# Patient Record
Sex: Female | Born: 1995 | Race: White | Hispanic: No | Marital: Single | Smoking: Smoker, current status unknown
Health system: Southern US, Community
[De-identification: ages and names within clinical notes are randomized; demographics above are authoritative.]

---

## 2019-01-28 ENCOUNTER — Encounter: Payer: Self-pay | Admitting: Primary Care

## 2019-01-28 ENCOUNTER — Ambulatory Visit (INDEPENDENT_AMBULATORY_CARE_PROVIDER_SITE_OTHER): Payer: 59 | Admitting: Primary Care

## 2019-01-28 VITALS — BP 120/76 | HR 70 | Temp 98.4°F | Ht 64.25 in | Wt 238.5 lb

## 2019-01-28 DIAGNOSIS — F4323 Adjustment disorder with mixed anxiety and depressed mood: Secondary | ICD-10-CM | POA: Diagnosis not present

## 2019-01-28 DIAGNOSIS — Z0000 Encounter for general adult medical examination without abnormal findings: Secondary | ICD-10-CM | POA: Diagnosis not present

## 2019-01-28 DIAGNOSIS — Z23 Encounter for immunization: Secondary | ICD-10-CM

## 2019-01-28 LAB — COMPREHENSIVE METABOLIC PANEL
ALT: 14 U/L (ref 0–35)
AST: 14 U/L (ref 0–37)
Albumin: 4.4 g/dL (ref 3.5–5.2)
Alkaline Phosphatase: 66 U/L (ref 39–117)
BUN: 10 mg/dL (ref 6–23)
CO2: 26 mEq/L (ref 19–32)
Calcium: 9.5 mg/dL (ref 8.4–10.5)
Chloride: 104 mEq/L (ref 96–112)
Creatinine, Ser: 0.77 mg/dL (ref 0.40–1.20)
GFR: 93.53 mL/min (ref >60.00)
Glucose, Bld: 91 mg/dL (ref 70–99)
Potassium: 4.4 mEq/L (ref 3.5–5.1)
Sodium: 137 mEq/L (ref 135–145)
Total Bilirubin: 0.3 mg/dL (ref 0.2–1.2)
Total Protein: 7.3 g/dL (ref 6.0–8.3)

## 2019-01-28 LAB — CBC
HCT: 39.1 % (ref 36.0–46.0)
Hemoglobin: 12.9 g/dL (ref 12.0–15.0)
MCHC: 33 g/dL (ref 30.0–36.0)
MCV: 85.6 fl (ref 78.0–100.0)
Platelets: 326 10*3/uL (ref 150.0–400.0)
RBC: 4.56 Mil/uL (ref 3.87–5.11)
RDW: 14.1 % (ref 11.5–15.5)
WBC: 9.9 10*3/uL (ref 4.0–10.5)

## 2019-01-28 LAB — LIPID PANEL
Cholesterol: 146 mg/dL (ref 0–200)
HDL: 37.7 mg/dL — ABNORMAL LOW (ref >39.00)
LDL Cholesterol: 87 mg/dL (ref 0–99)
NonHDL: 108.7
Total CHOL/HDL Ratio: 4
Triglycerides: 111 mg/dL (ref 0.0–149.0)
VLDL: 22.2 mg/dL (ref 0.0–40.0)

## 2019-01-28 LAB — VITAMIN B12: Vitamin B-12: 140 pg/mL — ABNORMAL LOW (ref 211–911)

## 2019-01-28 LAB — TSH: TSH: 1.14 u[IU]/mL (ref 0.35–4.50)

## 2019-01-28 LAB — HEMOGLOBIN A1C: Hgb A1c MFr Bld: 5.6 % (ref 4.6–6.5)

## 2019-01-28 LAB — VITAMIN D 25 HYDROXY (VIT D DEFICIENCY, FRACTURES): VITD: 21.41 ng/mL — AB (ref 30.00–100.00)

## 2019-01-28 MED ORDER — SERTRALINE HCL 25 MG PO TABS: 25.0000 mg | ORAL_TABLET | Freq: Every day | ORAL | 1 refills | Status: DC

## 2019-01-28 NOTE — Patient Instructions (Addendum)
Stop by the lab prior to leaving today. I will notify you of your results once received.   Continue exercising. You should be getting 150 minutes of moderate intensity exercise weekly.  It's important to improve your diet by reducing consumption of fast food, fried food, processed snack foods, sugary drinks. Increase consumption of fresh vegetables and fruits, whole grains, water.  Ensure you are drinking 64 ounces of water daily.  Start sertraline (Zolof) 25 mg for anxiety and depression. Start by taking 1/2 tablet daily for 6 days, then increase to 1 full tablet thereafter.  Please get logged onto my chart so we can communicate and so that you can view your labs.  Schedule a follow up visit for 6 weeks for re-evaluation. Please message/call me sooner if you have any problems.  It was a pleasure to see you today! Please don't hesitate to call or message me with any questions. Welcome to [facility]!   Preventive Care 18-39 Years, Female Preventive care refers to lifestyle choices and visits with your health care provider that can promote health and wellness. What does preventive care include?   A yearly physical exam. This is also called an annual well check.  Dental exams once or twice a year.  Routine eye exams. Ask your health care provider how often you should have your eyes checked.  Personal lifestyle choices, including: ? Daily care of your teeth and gums. ? Regular physical activity. ? Eating a healthy diet. ? Avoiding tobacco and drug use. ? Limiting alcohol use. ? Practicing safe sex. ? Taking vitamin and mineral supplements as recommended by your health care provider. What happens during an annual well check? The services and screenings done by your health care provider during your annual well check will depend on your age, overall health, lifestyle risk factors, and family history of disease. Counseling Your health care provider may ask you questions about  your:  Alcohol use.  Tobacco use.  Drug use.  Emotional well-being.  Home and relationship well-being.  Sexual activity.  Eating habits.  Work and work Statistician.  Method of birth control.  Menstrual cycle.  Pregnancy history. Screening You may have the following tests or measurements:  Height, weight, and BMI.  Diabetes screening. This is done by checking your blood sugar (glucose) after you have not eaten for a while (fasting).  Blood pressure.  Lipid and cholesterol levels. These may be checked every 5 years starting at age 9.  Skin check.  Hepatitis C blood test.  Hepatitis B blood test.  Sexually transmitted disease (STD) testing.  BRCA-related cancer screening. This may be done if you have a family history of breast, ovarian, tubal, or peritoneal cancers.  Pelvic exam and Pap test. This may be done every 3 years starting at age 43. Starting at age 41, this may be done every 5 years if you have a Pap test in combination with an HPV test. Discuss your test results, treatment options, and if necessary, the need for more tests with your health care provider. Vaccines Your health care provider may recommend certain vaccines, such as:  Influenza vaccine. This is recommended every year.  Tetanus, diphtheria, and acellular pertussis (Tdap, Td) vaccine. You may need a Td booster every 10 years.  Varicella vaccine. You may need this if you have not been vaccinated.  HPV vaccine. If you are 76 or younger, you may need three doses over 6 months.  Measles, mumps, and rubella (MMR) vaccine. You may need at least one  dose of MMR. You may also need a second dose.  Pneumococcal 13-valent conjugate (PCV13) vaccine. You may need this if you have certain conditions and were not previously vaccinated.  Pneumococcal polysaccharide (PPSV23) vaccine. You may need one or two doses if you smoke cigarettes or if you have certain conditions.  Meningococcal vaccine. One  dose is recommended if you are age 47-21 years and a first-year college student living in a residence hall, or if you have one of several medical conditions. You may also need additional booster doses.  Hepatitis A vaccine. You may need this if you have certain conditions or if you travel or work in places where you may be exposed to hepatitis A.  Hepatitis B vaccine. You may need this if you have certain conditions or if you travel or work in places where you may be exposed to hepatitis B.  Haemophilus influenzae type b (Hib) vaccine. You may need this if you have certain risk factors. Talk to your health care provider about which screenings and vaccines you need and how often you need them. This information is not intended to replace advice given to you by your health care provider. Make sure you discuss any questions you have with your health care provider. Document Released: 01/31/2002 Document Revised: 07/18/2017 Document Reviewed: 10/06/2015 Elsevier Interactive Patient Education  2019 [employer].

## 2019-01-28 NOTE — Assessment & Plan Note (Signed)
Tetanus due today, provided. Declines influenza vaccination. HPV completed. Pap smear UTD per patient, follows with GYN. Discussed the importance of a healthy diet and regular exercise in order for weight loss, and to reduce the risk of any potential medical problems. Discussed to stop fast food, sweet tea. Increase home cooked meals. Exam unremarkable. Labs pending. Follow up in 1 year for CPE.

## 2019-01-28 NOTE — Addendum Note (Signed)
Addended by: Tawnya Crook on: 01/28/2019 12:31 PM   Modules accepted: Orders

## 2019-01-28 NOTE — Assessment & Plan Note (Signed)
Chronic for years, GAD 7 score of 17 and PHQ 9 score of 14 today.   Given symptoms and duration of symptoms will likely need treatment. We discussed several options for treatment including therapy vs medication. She kindly declines therapy. Rx for Zoloft 25 mg sent to pharmacy.   Patient is to take 1/2 tablet daily for 6 days, then advance to 1 full tablet thereafter. We discussed possible side effects of headache, GI upset, drowsiness, and SI/HI. If thoughts of SI/HI develop, we discussed to present to the emergency immediately. Patient verbalized understanding.   Follow up in 6 weeks for re-evaluation.

## 2019-01-28 NOTE — Progress Notes (Signed)
Subjective:    Patient ID: Emily Yates, female    DOB: Jan 14, 1996, 23 y.o.   MRN: [id number]  HPI  Emily Yates is a 23 year old female who presents today to establish care, complete physical, and discuss the problems mentioned below. Will obtain old records.  Symptoms of chest tightness, worry, irritability, feeling sad/down, began 2-3 years ago. Family history of depression in mother. She's never undergone treatment in the past. She has a fear of death when driving, avoids populated areas with traffic. GAD 7 score of 17 and PHQ 9 of 14 today.  Immunizations: -Tetanus: Completed in 2009 -Influenza: Declines  -HPV: Completed in 2010-2011  Diet: She endorses a poor diet Breakfast: Skips Lunch: Fast food Dinner: Fast food  Snacks: None Desserts: Twice weekly  Beverages: Water, sweet tea, occasional soda  Exercise: Plays softball for [employer], exercises 6 days weekly Eye exam: Completed in 2019 Dental exam: No recent exam Pap Smear: Completed at 21, follows with GYN.    Review of Systems  Constitutional: Negative for unexpected weight change.  HENT: Negative for rhinorrhea.   Respiratory: Negative for cough and shortness of breath.   Cardiovascular: Negative for chest pain.  Gastrointestinal: Negative for constipation and diarrhea.  Endocrine: Positive for polyuria. Negative for polydipsia and polyphagia.  Genitourinary: Negative for difficulty urinating and menstrual problem.  Musculoskeletal: Negative for arthralgias and myalgias.  Skin: Negative for rash.  Allergic/Immunologic: Positive for environmental allergies.  Neurological: Negative for dizziness, numbness and headaches.       Headaches occurring 1-2 times weekly, located to bilateral front lobe. Takes Ibuprofen and sleeps with resolve. Does have photophobia.   Psychiatric/Behavioral:       Concerns for anxiety and depression. See HPI     History reviewed. No pertinent past medical history.     Social History   Socioeconomic History  . Marital status: Single    Spouse name: Not on file  . Number of children: Not on file  . Years of education: Not on file  . Highest education level: Not on file  Occupational History  . Not on file  Social Needs  . Financial resource strain: Not on file  . Food insecurity:    Worry: Not on file    Inability: Not on file  . Transportation needs:    Medical: Not on file    Non-medical: Not on file  Tobacco Use  . Smoking status: Smoker, Current Status Unknown  . Smokeless tobacco: Never Used  . Tobacco comment: Vaping  Substance and Sexual Activity  . Alcohol use: Yes  . Drug use: Not on file  . Sexual activity: Not on file  Lifestyle  . Physical activity:    Days per week: Not on file    Minutes per session: Not on file  . Stress: Not on file  Relationships  . Social connections:    Talks on phone: Not on file    Gets together: Not on file    Attends religious service: Not on file    Active member of club or organization: Not on file    Attends meetings of clubs or organizations: Not on file    Relationship status: Not on file  . Intimate partner violence:    Fear of current or ex partner: Not on file    Emotionally abused: Not on file    Physically abused: Not on file    Forced sexual activity: Not on file  Other Topics Concern  . Not  on file  Social History Scientist, research (medical)arrative   Student at [employer].   Plays softball.   Aspires to pursue MBA.    History reviewed. No pertinent surgical history.  Family History  Problem Relation Age of Onset  . Arthritis Mother   . Depression Mother   . Miscarriages / [place] Mother   . Depression Father   . Hypertension Father     No Known Allergies  No current outpatient medications on file prior to visit.   No current facility-administered medications on file prior to visit.     BP 120/76   Pulse 70   Temp 98.4 F (36.9 C) (Oral)   Ht 5' 4.25" (1.632 m)   Wt 238  lb 8 oz (108.2 kg)   SpO2 98%   BMI 40.62 kg/m    Objective:   Physical Exam  Constitutional: She is oriented to person, place, and time. She appears well-nourished.  HENT:  Mouth/Throat: No oropharyngeal exudate.  Eyes: Pupils are equal, round, and reactive to light. EOM are normal.  Neck: Neck supple. No thyromegaly present.  Cardiovascular: Normal rate and regular rhythm.  Respiratory: Effort normal and breath sounds normal.  GI: Soft. Bowel sounds are normal. There is no abdominal tenderness.  Musculoskeletal: Normal range of motion.  Neurological: She is alert and oriented to person, place, and time.  Skin: Skin is warm and dry.  Psychiatric: She has a normal mood and affect.           Assessment & Plan:

## 2019-02-01 ENCOUNTER — Other Ambulatory Visit: Payer: Self-pay | Admitting: Primary Care

## 2019-02-01 DIAGNOSIS — E538 Deficiency of other specified B group vitamins: Secondary | ICD-10-CM

## 2019-02-01 NOTE — Telephone Encounter (Signed)
Emily Yates, we need monthly B12 nurse visits x 6 months, repeat lab only appointment in 3 months for B12 check. Can you help? Thanks!

## 2019-02-04 ENCOUNTER — Telehealth: Payer: Self-pay | Admitting: Primary Care

## 2019-02-04 NOTE — Telephone Encounter (Signed)
Lvm asking pt to call office to schedule appointments for b12 per [facility]. Pt needs to be scheduled for b12 injections monthly for the next 6 months. Pt also needs a lab only appointment to check b12 levels at 3 months.

## 2019-02-06 DIAGNOSIS — R55 Syncope and collapse: Secondary | ICD-10-CM

## 2019-02-08 ENCOUNTER — Encounter: Payer: Self-pay | Admitting: Primary Care

## 2019-02-20 ENCOUNTER — Ambulatory Visit (INDEPENDENT_AMBULATORY_CARE_PROVIDER_SITE_OTHER): Payer: 59

## 2019-02-20 DIAGNOSIS — E538 Deficiency of other specified B group vitamins: Secondary | ICD-10-CM

## 2019-02-20 MED ORDER — CYANOCOBALAMIN 1000 MCG/ML IJ SOLN
1000.0000 ug | INTRAMUSCULAR | Status: DC
  Administered 2019-02-20: 1000 ug via INTRAMUSCULAR

## 2019-02-20 NOTE — Progress Notes (Signed)
Per orders of Katherine Clark, NP, injection of B12 given by Cortnee Steinmiller Y.  Patient tolerated injection well.  

## 2019-03-04 ENCOUNTER — Other Ambulatory Visit (HOSPITAL_COMMUNITY): Payer: 59

## 2019-03-08 ENCOUNTER — Telehealth: Payer: Self-pay | Admitting: Primary Care

## 2019-03-08 NOTE — Telephone Encounter (Signed)
Lvm asking pt to call office. Wanted to discuss making 3/23 telephone call to prevent patient from having to come into office.

## 2019-03-11 ENCOUNTER — Ambulatory Visit: Payer: 59 | Admitting: Primary Care

## 2019-03-13 ENCOUNTER — Telehealth (INDEPENDENT_AMBULATORY_CARE_PROVIDER_SITE_OTHER): Payer: 59 | Admitting: Primary Care

## 2019-03-13 ENCOUNTER — Other Ambulatory Visit: Payer: Self-pay

## 2019-03-13 DIAGNOSIS — E538 Deficiency of other specified B group vitamins: Secondary | ICD-10-CM

## 2019-03-13 DIAGNOSIS — F4323 Adjustment disorder with mixed anxiety and depressed mood: Secondary | ICD-10-CM | POA: Diagnosis not present

## 2019-03-13 MED ORDER — SERTRALINE HCL 25 MG PO TABS: 25.0000 mg | ORAL_TABLET | Freq: Every day | ORAL | 1 refills | Status: DC

## 2019-03-13 NOTE — Telephone Encounter (Signed)
Irving Burton, Will you please put her on the schedule for anytime today for phone or webex visit? No need to call her. I'll just call her when I get a chance. She is aware.

## 2019-03-13 NOTE — Patient Instructions (Signed)
Continue taking sertraline (Zoloft) 25 mg daily for anxiety and depression. I just sent plenty of refills to your pharmacy in [place].  Continue taking B12 1000 mcg tablets daily. We will contact you when you are available to set up your B12 injection. Please do not hesitate to message me with any questions/concerns.  It was nice to talk to you! Mayra Reel, NP-C

## 2019-03-13 NOTE — Progress Notes (Signed)
   Emily Yates - 23 y.o. female  MRN [id number]  Date of Birth: [birth year]  PCP: Doreene Nest, NP  This service was provided via telemedicine. Phone Visit performed on 03/13/2019   Patient initially contacted at 9:10 am, no answer, voice mail left for call back. Patient contacted again at 1 pm and answered.   Rationale for phone visit reviewed. Patient consented to telephone encounter.    [place] Location of provider: Office at [facility] Name of referring provider: N/A   Names of persons and role in encounter: Provider: Doreene Nest, NP  Patient: Emily Yates  Other: N/A   Time on call: 11 minutes   Subjective: CC: Follow up of Anxiety  HPI:  Emily Yates is a 23 year old female who presents today via phone for follow up of anxiety and depression.  She was last evaluated on January 28, 2019 with a 2 to 3-year history of chest tightness, daily worry, irritability, feeling sad/down.  GAD 7 score of 17 and PHQ 9 score of 14 that day so she was initiated on Zoloft 25 mg.  Of note labs completed that day showed vitamin B12 deficiency so she was initiated on monthly injections.  Other lab testing including TSH, CBC were unremarkable.  Since her last evaluation she's doing better. Positive effects from Zoloft include feeling less stressed, is able to control her emotions better, feeling less sad/down, less worry. GAD 7 score of 5 today, PHQ 9 score of 5 today. Denies SI/HI, GI upset, nausea, headaches.    Objective/Observations:   No physical exam or vital signs collected unless specifically identified.  Respiratory status: speaks in complete sentences without evident shortness of breath.   Assessment/Plan:  Appears to have significantly improved with addition of Zoloft 25 mg.  She is having no negative side effects. We will continue Zoloft 25 mg daily as prescribed. She will update with any changes.   No  problem-specific Assessment & Plan notes found for this encounter.   I discussed the assessment and treatment plan with the patient. The patient was provided an opportunity to ask questions and all were answered. The patient agreed with the plan and demonstrated an understanding of the instructions.  Lab Orders  No laboratory test(s) ordered today    No orders of the defined types were placed in this encounter.   The patient was advised to call back or seek an in-person evaluation if the symptoms worsen or if the condition fails to improve as anticipated.  Doreene Nest, NP

## 2019-03-13 NOTE — Assessment & Plan Note (Signed)
Appears to have significantly improved with addition of Zoloft 25 mg.  She is having no negative side effects. We will continue Zoloft 25 mg daily as prescribed. She will update with any changes.

## 2019-03-13 NOTE — Assessment & Plan Note (Signed)
Recent level of 140. Has completed 1 B12 injection, has not been able to complete subsequent injections due to pandemic of Covid-19. There is a shelter in place order that was initiated today. She is compliant to 1000 mcg once daily for which we will continue for now. We will contact her when able to complete subsequent injections and lab repeat.

## 2019-03-27 ENCOUNTER — Ambulatory Visit: Payer: 59

## 2019-03-27 ENCOUNTER — Ambulatory Visit (INDEPENDENT_AMBULATORY_CARE_PROVIDER_SITE_OTHER): Payer: 59

## 2019-03-27 ENCOUNTER — Other Ambulatory Visit: Payer: Self-pay

## 2019-03-27 DIAGNOSIS — E538 Deficiency of other specified B group vitamins: Secondary | ICD-10-CM | POA: Diagnosis not present

## 2019-03-27 MED ORDER — CYANOCOBALAMIN 1000 MCG/ML IJ SOLN
1000.0000 ug | Freq: Once | INTRAMUSCULAR | Status: AC
  Administered 2019-03-27: 1000 ug via INTRAMUSCULAR

## 2019-03-27 NOTE — Progress Notes (Signed)
Pt given B12 injection in Right Deltoid. Pt tolerated well.  

## 2019-04-12 NOTE — Progress Notes (Signed)
Approved.  

## 2019-04-30 ENCOUNTER — Telehealth: Payer: Self-pay

## 2019-04-30 ENCOUNTER — Other Ambulatory Visit: Payer: 59

## 2019-04-30 ENCOUNTER — Ambulatory Visit: Payer: 59

## 2019-04-30 NOTE — Telephone Encounter (Signed)
[facility] Night - Client Nonclinical Telephone Record [facility] Client [facility] Night - Client Client Site [facility] - Night Physician [facility] - PHYSICIAN, Crissie Figures- MD Contact Type Call Who Is Calling Patient / Member / Family / Caregiver Caller Name Adaleena Maring Caller Phone Number [phone] Patient Name Emily Yates Patient DOB [birth year] Call Type Message Only Information Provided Reason for Call Request to [facility] Appointment Initial Comment Caller states she needs to cancel her 9:00am appt for labs and nurse visit at 9:15.

## 2019-04-30 NOTE — Telephone Encounter (Signed)
Called to let patient know appointments were cancelled and to see if she wanted to reschedule. Lvm explaining this.

## 2019-06-04 ENCOUNTER — Telehealth: Payer: Self-pay

## 2019-06-04 ENCOUNTER — Other Ambulatory Visit: Payer: 59

## 2019-06-04 DIAGNOSIS — Z20822 Contact with and (suspected) exposure to covid-19: Secondary | ICD-10-CM

## 2019-06-04 NOTE — Telephone Encounter (Signed)
Tera Helper request COVID 19 test due to exposure.Pt. scheduled for today.

## 2019-06-06 LAB — NOVEL CORONAVIRUS, NAA: SARS-CoV-2, NAA: NOT DETECTED

## 2019-06-07 ENCOUNTER — Telehealth: Payer: Self-pay | Admitting: Primary Care

## 2019-06-07 NOTE — Telephone Encounter (Signed)
° °  Pt was given COVID results °

## 2019-09-05 ENCOUNTER — Other Ambulatory Visit: Payer: Self-pay

## 2019-09-05 DIAGNOSIS — Z20822 Contact with and (suspected) exposure to covid-19: Secondary | ICD-10-CM

## 2019-09-07 LAB — NOVEL CORONAVIRUS, NAA: SARS-CoV-2, NAA: NOT DETECTED

## 2019-10-16 ENCOUNTER — Other Ambulatory Visit: Payer: Self-pay

## 2019-10-16 DIAGNOSIS — Z20822 Contact with and (suspected) exposure to covid-19: Secondary | ICD-10-CM

## 2019-10-17 ENCOUNTER — Encounter: Payer: Self-pay | Admitting: Family Medicine

## 2019-10-17 ENCOUNTER — Telehealth: Payer: Self-pay | Admitting: Primary Care

## 2019-10-17 LAB — NOVEL CORONAVIRUS, NAA: SARS-CoV-2, NAA: NOT DETECTED

## 2019-10-17 NOTE — Telephone Encounter (Signed)
Patient called.  Patient needs documentation to show she tested negative for Covid, so she can return to work.  Patient would like to return to work tomorrow. Please call patient when note is ready. Since Anda Kraft and Vallarie Mare are out of the office, can Dr.Bedsole do a note?  Patient needs the note today.

## 2019-10-17 NOTE — Telephone Encounter (Signed)
Can you please review chart and let me know if I can write work note clearing patient to go back to work tomorrow.  Anda Kraft and Vallarie Mare are out of the office this afternoon.

## 2019-10-17 NOTE — Telephone Encounter (Signed)
Appears okay to return to work per [employer] from PCP. Looks like she planned to send note via [place]. I am not sure what happened. I will write note and send if able in MyChart.

## 2019-10-17 NOTE — Telephone Encounter (Signed)
This was attached earlier today.

## 2019-10-17 NOTE — Telephone Encounter (Signed)
Left message for Emily Yates that Dr. Diona Browner has sent her a work note to her 37.

## 2019-10-21 ENCOUNTER — Telehealth: Payer: Self-pay | Admitting: Primary Care

## 2019-10-21 NOTE — Telephone Encounter (Signed)
Alihsa @[employer]  called stating pt was tested for covid and received 2 work notes.  They needed one  note to say please excuse pt for 10/28 and 10/29 due covid testing  Please let pt know when note is ready or put on my chart for pt

## 2019-10-22 NOTE — Telephone Encounter (Signed)
This already been addressed through [facility]

## 2020-02-21 ENCOUNTER — Encounter: Payer: Self-pay | Admitting: Primary Care

## 2020-02-21 ENCOUNTER — Other Ambulatory Visit: Payer: Self-pay

## 2020-02-21 ENCOUNTER — Ambulatory Visit (INDEPENDENT_AMBULATORY_CARE_PROVIDER_SITE_OTHER)
Admission: RE | Admit: 2020-02-21 | Discharge: 2020-02-21 | Disposition: A | Discharge status: Home / Self Care | Payer: 59 | Source: Ambulatory Visit | Attending: Primary Care | Admitting: Primary Care

## 2020-02-21 ENCOUNTER — Ambulatory Visit (INDEPENDENT_AMBULATORY_CARE_PROVIDER_SITE_OTHER): Payer: 59 | Admitting: Primary Care

## 2020-02-21 VITALS — BP 126/76 | HR 78 | Temp 96.9°F | Ht 64.25 in | Wt 252.8 lb

## 2020-02-21 DIAGNOSIS — M25551 Pain in right hip: Secondary | ICD-10-CM

## 2020-02-21 DIAGNOSIS — E559 Vitamin D deficiency, unspecified: Secondary | ICD-10-CM | POA: Insufficient documentation

## 2020-02-21 DIAGNOSIS — E538 Deficiency of other specified B group vitamins: Secondary | ICD-10-CM | POA: Diagnosis not present

## 2020-02-21 DIAGNOSIS — F4323 Adjustment disorder with mixed anxiety and depressed mood: Secondary | ICD-10-CM

## 2020-02-21 DIAGNOSIS — R55 Syncope and collapse: Secondary | ICD-10-CM | POA: Diagnosis not present

## 2020-02-21 DIAGNOSIS — R42 Dizziness and giddiness: Secondary | ICD-10-CM | POA: Diagnosis not present

## 2020-02-21 HISTORY — DX: Pain in right hip: M25.551

## 2020-02-21 HISTORY — DX: Dizziness and giddiness: R42

## 2020-02-21 HISTORY — DX: Syncope and collapse: R55

## 2020-02-21 LAB — COMPREHENSIVE METABOLIC PANEL
ALT: 17 U/L (ref 0–35)
AST: 16 U/L (ref 0–37)
Albumin: 4.2 g/dL (ref 3.5–5.2)
Alkaline Phosphatase: 62 U/L (ref 39–117)
BUN: 13 mg/dL (ref 6–23)
CO2: 27 mEq/L (ref 19–32)
Calcium: 9.5 mg/dL (ref 8.4–10.5)
Chloride: 105 mEq/L (ref 96–112)
Creatinine, Ser: 0.78 mg/dL (ref 0.40–1.20)
GFR: 91.28 mL/min (ref >60.00)
Glucose, Bld: 88 mg/dL (ref 70–99)
Potassium: 3.9 mEq/L (ref 3.5–5.1)
Sodium: 138 mEq/L (ref 135–145)
Total Bilirubin: 0.4 mg/dL (ref 0.2–1.2)
Total Protein: 7.3 g/dL (ref 6.0–8.3)

## 2020-02-21 LAB — CBC
HCT: 37.8 % (ref 36.0–46.0)
Hemoglobin: 12.5 g/dL (ref 12.0–15.0)
MCHC: 33 g/dL (ref 30.0–36.0)
MCV: 84.5 fl (ref 78.0–100.0)
Platelets: 339 10*3/uL (ref 150.0–400.0)
RBC: 4.48 Mil/uL (ref 3.87–5.11)
RDW: 13.8 % (ref 11.5–15.5)
WBC: 9.8 10*3/uL (ref 4.0–10.5)

## 2020-02-21 LAB — TSH: TSH: 0.9 u[IU]/mL (ref 0.35–4.50)

## 2020-02-21 LAB — VITAMIN B12: Vitamin B-12: 150 pg/mL — ABNORMAL LOW (ref 211–911)

## 2020-02-21 LAB — VITAMIN D 25 HYDROXY (VIT D DEFICIENCY, FRACTURES): VITD: 19.12 ng/mL — ABNORMAL LOW (ref 30.00–100.00)

## 2020-02-21 MED ORDER — PREDNISONE 20 MG PO TABS: ORAL_TABLET | ORAL | 0 refills | Status: DC

## 2020-02-21 MED ORDER — SERTRALINE HCL 25 MG PO TABS: 25.0000 mg | ORAL_TABLET | Freq: Every day | ORAL | 3 refills | Status: DC

## 2020-02-21 NOTE — Assessment & Plan Note (Signed)
Acute for the last 10 days, also with near syncope.  Checking labs today including CBC, CMP, TSH, vitamin levels.  Ear and neuro exam unremarkable. Doesn't seem like vertigo.  ECG unremarkable. Checking echocardiogram to rule out cardiac cause.  Consider CT head given chronic headaches. Await results.

## 2020-02-21 NOTE — Progress Notes (Signed)
Subjective:    Patient ID: Emily Yates, female    DOB: 05/04/1996, 24 y.o.   MRN: [id number]  HPI  This visit occurred during the SARS-CoV-2 public health emergency.  Safety protocols were in place, including screening questions prior to the visit, additional usage of staff PPE, and extensive cleaning of exam room while observing appropriate contact time as indicated for disinfecting solutions.   Emily Yates is a 24 year old female who presents today with a chief complaint of lower extremity pain.  1) Hip Pain/Lower Extremity Numbness: Acute right lateral hip pain with numbness and tingling down the right lower extremity to the toes. This began 10 days ago. She will have to pick up her leg at times to move it. She has pain and decrease in ROM with flexion and external rotation.    She denies injury/trauma, back pain, left lower extremity numbness.   She's played softball since school age, has not played in one year. She would intermittently notice "popping" of her right hip during softball season, has noticed this more frequently now.  2) Dizziness/Near Syncope: Intermittent dizziness for the last week that occurs more often than not. She denies allergy symptoms, sensations of the room spinning around her, changes in diet, new supplements, visual changes, palpitations, ear fullness. She endorses plenty of water intake.   Feels like she's off balance. Has felt near syncopal episodes several times within the last week. She has a prior history of near syncope for which she mentioned in a My Chart message on 02/06/19. Per her message she endorses feeling like she's "blacking out" when running during softball practice with headaches occurring after. At that time we decided to pursue an echocardiogram given her symptoms while exerting herself, she never had this done. Her recent syncopal episodes feel similar to her prior episodes one year ago.  BP Readings from Last 3 Encounters:  02/21/20  126/76  01/28/19 120/76     Review of Systems  Constitutional: Negative for fatigue and fever.  Eyes: Negative for visual disturbance.  Respiratory: Negative for shortness of breath.   Cardiovascular: Negative for chest pain and palpitations.  Gastrointestinal: Negative for nausea.  Musculoskeletal: Negative for back pain.       Right hip pain  Skin: Negative for color change.  Allergic/Immunologic: Negative for environmental allergies.  Neurological: Positive for dizziness, weakness, numbness and headaches.       Near syncope        No past medical history on file.   Social History   Socioeconomic History  . Marital status: Single    Spouse name: Not on file  . Number of children: Not on file  . Years of education: Not on file  . Highest education level: Not on file  Occupational History  . Not on file  Tobacco Use  . Smoking status: Smoker, Current Status Unknown  . Smokeless tobacco: Never Used  . Tobacco comment: Vaping  Substance and Sexual Activity  . Alcohol use: Yes  . Drug use: Not on file  . Sexual activity: Not on file  Other Topics Concern  . Not on file  Social History Narrative   Ship broker at [employer].   Plays softball.   Aspires to pursue MBA.   Social Determinants of Health   Financial Resource Strain:   . Difficulty of Paying Living Expenses: Not on file  Food Insecurity:   . Worried About Charity fundraiser in the Last Year: Not on file  .  Ran Out of Food in the Last Year: Not on file  Transportation Needs:   . Lack of Transportation (Medical): Not on file  . Lack of Transportation (Non-Medical): Not on file  Physical Activity:   . Days of Exercise per Week: Not on file  . Minutes of Exercise per Session: Not on file  Stress:   . Feeling of Stress : Not on file  Social Connections:   . Frequency of Communication with Friends and Family: Not on file  . Frequency of Social Gatherings with Friends and Family: Not on file  .  Attends Religious Services: Not on file  . Active Member of Clubs or Organizations: Not on file  . Attends Banker Meetings: Not on file  . Marital Status: Not on file  Intimate Partner Violence:   . Fear of Current or Ex-Partner: Not on file  . Emotionally Abused: Not on file  . Physically Abused: Not on file  . Sexually Abused: Not on file    No past surgical history on file.  Family History  Problem Relation Age of Onset  . Arthritis Mother   . Depression Mother   . Miscarriages / [place] Mother   . Depression Father   . Hypertension Father     No Known Allergies  No current outpatient medications on file prior to visit.   Current Facility-Administered Medications on File Prior to Visit  Medication Dose Route Frequency Provider Last Rate Last Admin  . cyanocobalamin ((VITAMIN B-12)) injection 1,000 mcg  1,000 mcg Intramuscular Q30 days Doreene Nest, NP   1,000 mcg at 02/20/19 0936    BP 126/76   Pulse 78   Temp (!) 96.9 F (36.1 C) (Temporal)   Ht 5' 4.25" (1.632 m)   Wt 252 lb 12 oz (114.6 kg)   LMP 02/19/2020   SpO2 98%   BMI 43.05 kg/m    Objective:   Physical Exam  Constitutional: She appears well-nourished.  HENT:  Right Ear: Tympanic membrane and ear canal normal.  Left Ear: Tympanic membrane and ear canal normal.  Eyes: EOM are normal.  Cardiovascular: Normal rate and regular rhythm.  Respiratory: Effort normal and breath sounds normal.  Musculoskeletal:     Cervical back: Neck supple.     Right hip: No deformity, tenderness or bony tenderness. Decreased range of motion. Normal strength.       Legs:     Comments: Decrease in ROM with pain with flexion, internal and external rotation. 5/5 strength to bilateral lower extremities.   Neurological: No cranial nerve deficit. Coordination normal.  Skin: Skin is warm and dry.  Psychiatric: She has a normal mood and affect.           Assessment & Plan:

## 2020-02-21 NOTE — Assessment & Plan Note (Signed)
Non compliant to vitamin B12 injections or tablets. Repeat B12 pending.

## 2020-02-21 NOTE — Patient Instructions (Signed)
Stop by the lab and xray prior to leaving today. I will notify you of your results once received.   Stop by the front desk and speak with either Shirlee Limerick or Charmaine regarding your echocardiogram.  It was a pleasure to see you today!

## 2020-02-21 NOTE — Assessment & Plan Note (Signed)
Acute for the last 10 days, history of softball playing for years. Exam today with decrease in ROM as noted.  Seems to clearly have nerve involvement given the numbness sensation. Strength equal bilaterally.   Check plain films of the hip today. Physical therapy orders placed.   Consider MRI in the future if needed.

## 2020-02-21 NOTE — Assessment & Plan Note (Signed)
Doing well on sertraline 25 mg, refills provided.

## 2020-02-21 NOTE — Assessment & Plan Note (Signed)
Repeat D level pending. She is not taking supplements.

## 2020-02-21 NOTE — Assessment & Plan Note (Signed)
Intermittent and chronic for one year, recent recurring episodes over the last 10 days.   Check labs today including CBC, CMP, TSH.  Negative orthostatic vitals in office today. ECG today with NSR, rate of 62, no PAC/PVC, ST changes. No prior to compare.  Given recurrent episodes we need to rule out cardiac cause. Echocardiogram ordered and pending.

## 2020-02-22 ENCOUNTER — Other Ambulatory Visit: Payer: Self-pay | Admitting: Primary Care

## 2020-02-22 DIAGNOSIS — E559 Vitamin D deficiency, unspecified: Secondary | ICD-10-CM

## 2020-02-22 MED ORDER — VITAMIN D (ERGOCALCIFEROL) 1.25 MG (50000 UNIT) PO CAPS: ORAL_CAPSULE | ORAL | 0 refills | Status: DC

## 2020-02-25 ENCOUNTER — Ambulatory Visit (HOSPITAL_COMMUNITY): Payer: 59 | Attending: Cardiology

## 2020-02-25 ENCOUNTER — Other Ambulatory Visit: Payer: Self-pay

## 2020-02-25 DIAGNOSIS — R42 Dizziness and giddiness: Secondary | ICD-10-CM | POA: Insufficient documentation

## 2020-02-25 DIAGNOSIS — R55 Syncope and collapse: Secondary | ICD-10-CM | POA: Insufficient documentation

## 2020-02-26 ENCOUNTER — Encounter: Payer: Self-pay | Admitting: *Deleted

## 2020-02-27 ENCOUNTER — Ambulatory Visit (INDEPENDENT_AMBULATORY_CARE_PROVIDER_SITE_OTHER): Payer: 59 | Admitting: Primary Care

## 2020-02-27 ENCOUNTER — Other Ambulatory Visit: Payer: Self-pay

## 2020-02-27 ENCOUNTER — Other Ambulatory Visit: Payer: Self-pay | Admitting: Primary Care

## 2020-02-27 DIAGNOSIS — F4323 Adjustment disorder with mixed anxiety and depressed mood: Secondary | ICD-10-CM

## 2020-02-27 DIAGNOSIS — G43709 Chronic migraine without aura, not intractable, without status migrainosus: Secondary | ICD-10-CM | POA: Diagnosis not present

## 2020-02-27 DIAGNOSIS — R55 Syncope and collapse: Secondary | ICD-10-CM | POA: Diagnosis not present

## 2020-02-27 DIAGNOSIS — G43909 Migraine, unspecified, not intractable, without status migrainosus: Secondary | ICD-10-CM | POA: Insufficient documentation

## 2020-02-27 DIAGNOSIS — M25551 Pain in right hip: Secondary | ICD-10-CM

## 2020-02-27 DIAGNOSIS — R32 Unspecified urinary incontinence: Secondary | ICD-10-CM

## 2020-02-27 DIAGNOSIS — IMO0002 Reserved for concepts with insufficient information to code with codable children: Secondary | ICD-10-CM

## 2020-02-27 HISTORY — DX: Unspecified urinary incontinence: R32

## 2020-02-27 MED ORDER — PROPRANOLOL HCL ER 80 MG PO CP24: 80.0000 mg | ORAL_CAPSULE | Freq: Every day | ORAL | 0 refills | Status: DC

## 2020-02-27 MED ORDER — SUMATRIPTAN SUCCINATE 50 MG PO TABS: ORAL_TABLET | ORAL | 0 refills | Status: DC

## 2020-02-27 NOTE — Assessment & Plan Note (Signed)
Echocardiogram negative which is reassuring. Labs also reassuring, except for vitamin D and B12 for which she will start replenishing.  Given her dizziness with the symptoms we will start working on her migraine treatment to see if this helps.  Follow-up in 1 month.

## 2020-02-27 NOTE — Assessment & Plan Note (Signed)
Chronic for years, never treated.  Migraine frequency is quite often, therefore, needs daily preventative treatment as well as abortive treatment.  She agrees.  Prescription for propanolol ER 80 mg sent to pharmacy for preventative treatment.  Prescription for sumatriptan 50 mg sent to pharmacy for abortive treatment.  We discussed specific directions for each medication, she verbalized understanding.  I will also leave her a MyChart message with specific instructions.  We will follow-up in 1 month.

## 2020-02-27 NOTE — Assessment & Plan Note (Signed)
Also with urinary frequency that is chronic.  Symptoms date back years ago.  While she does drink some caffeine, it does not seem that she drinks an excessive amount.  We discussed to do/eliminate all caffeine to see if this helps.  Regardless, her urinary incontinence is unusual given her age.  We discussed a urology evaluation, she agrees.  She will also come by the office to provide [place] with a urine specimen for testing..  Labs during her last visit were with normal glucose levels.

## 2020-02-27 NOTE — Patient Instructions (Signed)
Urinary incontinence:  I placed a referral for you to see a urologist given your urinary incontinence symptoms.  My referral coordinator will contact you within 2 weeks to discuss a date and time for your appointment.  I told her that you prefer [facility] [place].  I would like for you to come by my office at your earliest convenience to provide me with a urine specimen.  I also need to prick your finger.  Migraines:  For preventative treatment: Start propanolol ER 80 mg once every morning for migraine prevention.  For abortive treatment: Start sumatriptan (Imitrex) 50 mg tablets as needed for severe migraine.  Take 1 tablet by mouth at migraine onset, may repeat in 2 hours if migraine persists.  Do not exceed 2 tablets in 24 hours.  Complete physical therapy as scheduled.  I would like to see you back in a month for follow-up.  Please schedule this at your convenience.  It was a pleasure to see you today! Mayra Reel, NP-C

## 2020-02-27 NOTE — Progress Notes (Signed)
Subjective:    Patient ID: Emily Yates, female    DOB: 09/17/1996, 24 y.o.   MRN: [id number]  HPI     Emily Yates - 24 y.o. female  MRN [id number]  Date of Birth: 17-Apr-1996  PCP: Doreene Nest, NP  This service was provided via telemedicine. Phone Visit performed on 02/27/2020    Rationale for phone visit along with limitations reviewed. Patient consented to telephone encounter.    Location of patient: Home Location of provider: Office [place] [facility] Name of referring provider: N/A   Names of persons and role in encounter: Provider: Doreene Nest, NP  Patient: Emily Yates  Other: N/A   Time on call: 24 min - 02 sec   Subjective: No chief complaint on file.    HPI:  Emily Yates is a 24 year old female with a history of near syncope, chronic migraines, anxiety, depression, dizziness, vitamin D and B12 deficiency who presents today for follow up.  She was last evaluated on February 21, 2020 for numerous issues including dizziness, near syncope, hip pain.    She underwent x-ray of the right hip which was unremarkable, has since been referred to physical therapy and has her first appointment next week.  She has not yet picked up the prescription for prednisone but plans on doing so today.  Since her last visit she denies syncopal episodes, does continue to have intermittent dizziness and persistent migraines.  We did not address migraines during her last visit.  Migraines have been chronic since early adolescence and now occur 3 times weekly on average.  She has symptoms of photophobia, phonophobia, nausea with nearly every migraine.  Migraines are located to either the occipital or temporal lobes, and can be on the right/left or bilateral sides.  She mostly takes Excedrin Migraine with occasional improvement.  She has never been treated for migraine abortion or prevention.  She also mentions a history of urinary incontinence, chronic for years.  She  will have a sudden urge to urinate, and often loses control of her bladder if she does not make it to the bathroom in time.  She will lose her urine in the car, while sitting at her desk at work, walking to the bathroom.  This happens at least once weekly and will have to change clothing.  She denies loss of bladder control with sneezing/coughing/lifting.  She also denies numbness to the groin, dysuria, hematuria.  She has chronic urinary frequency, sometimes feels like she urinates once an hour.  Her right hip and lower extremity symptoms are recent, denies increased frequency of her urinary incontinence since lower extremity symptoms.  She endorses drinking a lot of caffeine during the day, but has really worked on cutting back.  Currently she drinks a few glasses of sweet tea, she used to drink [employer] but has cut this out.  She saw gynecology a few years ago, did not mention her symptoms at that time.  Objective/Observations:  No physical exam or vital signs collected unless specifically identified below.   LMP 02/19/2020    Respiratory status: speaks in complete sentences without evident shortness of breath.   Assessment/Plan:  See problem based charting  No problem-specific Assessment & Plan notes found for this encounter.   I discussed the assessment and treatment plan with the patient. The patient was provided an opportunity to ask questions and all were answered. The patient agreed with the plan and demonstrated an understanding of the instructions.  Lab  Orders  No laboratory test(s) ordered today    No orders of the defined types were placed in this encounter.   The patient was advised to call back or seek an in-person evaluation if the symptoms worsen or if the condition fails to improve as anticipated.  Doreene Nest, NP    Review of Systems  Eyes: Positive for photophobia.  Gastrointestinal: Positive for nausea.  Genitourinary:       Urinary incontinence     Neurological: Positive for dizziness and headaches. Negative for syncope.  Psychiatric/Behavioral:       Feels well managed on Zoloft.       No past medical history on file.   Social History   Socioeconomic History  . Marital status: Single    Spouse name: Not on file  . Number of children: Not on file  . Years of education: Not on file  . Highest education level: Not on file  Occupational History  . Not on file  Tobacco Use  . Smoking status: Smoker, Current Status Unknown  . Smokeless tobacco: Never Used  . Tobacco comment: Vaping  Substance and Sexual Activity  . Alcohol use: Yes  . Drug use: Not on file  . Sexual activity: Not on file  Other Topics Concern  . 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   Aspires to pursue MBA.   Social Determinants of Health   Financial Resource Strain:   . Difficulty of Paying Living Expenses:   Food Insecurity:   . Worried About Programme researcher, broadcasting/film/video in the Last Year:   . Barista in the Last Year:   Transportation Needs:   . Freight forwarder (Medical):   Marland Kitchen Lack of Transportation (Non-Medical):   Physical Activity:   . Days of Exercise per Week:   . Minutes of Exercise per Session:   Stress:   . Feeling of Stress :   Social Connections:   . Frequency of Communication with Friends and Family:   . Frequency of Social Gatherings with Friends and Family:   . Attends Religious Services:   . Active Member of Clubs or Organizations:   . Attends Banker Meetings:   Marland Kitchen Marital Status:   Intimate Partner Violence:   . Fear of Current or Ex-Partner:   . Emotionally Abused:   Marland Kitchen Physically Abused:   . Sexually Abused:     No past surgical history on file.  Family History  Problem Relation Age of Onset  . Arthritis Mother   . Depression Mother   . Miscarriages / [place] Mother   . Depression Father   . Hypertension Father     No Known  Allergies  Current Outpatient Medications on File Prior to Visit  Medication Sig Dispense Refill  . predniSONE (DELTASONE) 20 MG tablet Take 2 tablets by mouth daily for three days, then 1 tablet daily for three days. 9 tablet 0  . sertraline (ZOLOFT) 25 MG tablet Take 1 tablet (25 mg total) by mouth daily. For anxiety. 90 tablet 3  . Vitamin D, Ergocalciferol, (DRISDOL) 1.25 MG (50000 UNIT) CAPS capsule Take 1 capsule by mouth once weekly for 12 weeks. 12 capsule 0   Current Facility-Administered Medications on File Prior to Visit  Medication Dose Route Frequency Provider Last Rate Last Admin  . cyanocobalamin ((VITAMIN B-12)) injection 1,000 mcg  1,000 mcg Intramuscular Q30 days Doreene Nest, NP  1,000 mcg at 02/20/19 0936    LMP 02/19/2020    Objective:   Physical Exam  Constitutional: She is oriented to person, place, and time.  Respiratory: Effort normal.  Neurological: She is alert and oriented to person, place, and time.  Psychiatric: She has a normal mood and affect.           Assessment & Plan:

## 2020-02-27 NOTE — Assessment & Plan Note (Signed)
Plain films negative. She will start PT next week.  She will pick up the prednisone prescription today.

## 2020-02-28 ENCOUNTER — Ambulatory Visit (INDEPENDENT_AMBULATORY_CARE_PROVIDER_SITE_OTHER): Payer: 59 | Admitting: *Deleted

## 2020-02-28 ENCOUNTER — Other Ambulatory Visit (INDEPENDENT_AMBULATORY_CARE_PROVIDER_SITE_OTHER): Payer: 59

## 2020-02-28 ENCOUNTER — Other Ambulatory Visit: Payer: Self-pay

## 2020-02-28 DIAGNOSIS — R32 Unspecified urinary incontinence: Secondary | ICD-10-CM | POA: Diagnosis not present

## 2020-02-28 DIAGNOSIS — E538 Deficiency of other specified B group vitamins: Secondary | ICD-10-CM | POA: Diagnosis not present

## 2020-02-28 LAB — POCT GLYCOSYLATED HEMOGLOBIN (HGB A1C): Hemoglobin A1C: 5.3 % (ref 4.0–5.6)

## 2020-02-28 LAB — POC URINALSYSI DIPSTICK (AUTOMATED)
Bilirubin, UA: NEGATIVE
Blood, UA: NEGATIVE
Glucose, UA: NEGATIVE
Ketones, UA: NEGATIVE
Leukocytes, UA: NEGATIVE
Nitrite, UA: NEGATIVE
Protein, UA: NEGATIVE
Spec Grav, UA: 1.015 (ref 1.010–1.025)
Urobilinogen, UA: 0.2 E.U./dL
pH, UA: 8.5 — AB (ref 5.0–8.0)

## 2020-02-28 MED ORDER — CYANOCOBALAMIN 1000 MCG/ML IJ SOLN
1000.0000 ug | Freq: Once | INTRAMUSCULAR | Status: AC
  Administered 2020-02-28: 1000 ug via INTRAMUSCULAR

## 2020-02-28 NOTE — Progress Notes (Signed)
Per orders of Mayra Reel, NP, injection of B12 1000 mcg/mL given by Crosby Bevan. Patient tolerated injection well.

## 2020-03-02 DIAGNOSIS — F4323 Adjustment disorder with mixed anxiety and depressed mood: Secondary | ICD-10-CM

## 2020-03-05 DIAGNOSIS — Z820 Family history of epilepsy and other diseases of the nervous system: Secondary | ICD-10-CM

## 2020-03-05 DIAGNOSIS — G43709 Chronic migraine without aura, not intractable, without status migrainosus: Secondary | ICD-10-CM

## 2020-03-05 DIAGNOSIS — IMO0002 Reserved for concepts with insufficient information to code with codable children: Secondary | ICD-10-CM

## 2020-03-05 DIAGNOSIS — R32 Unspecified urinary incontinence: Secondary | ICD-10-CM

## 2020-03-05 DIAGNOSIS — R42 Dizziness and giddiness: Secondary | ICD-10-CM

## 2020-03-09 ENCOUNTER — Other Ambulatory Visit: Payer: Self-pay

## 2020-03-09 ENCOUNTER — Ambulatory Visit
Admission: RE | Admit: 2020-03-09 | Discharge: 2020-03-09 | Disposition: A | Discharge status: Home / Self Care | Payer: 59 | Source: Ambulatory Visit | Attending: Primary Care | Admitting: Primary Care

## 2020-03-09 DIAGNOSIS — R32 Unspecified urinary incontinence: Secondary | ICD-10-CM

## 2020-03-09 DIAGNOSIS — R42 Dizziness and giddiness: Secondary | ICD-10-CM | POA: Insufficient documentation

## 2020-03-09 DIAGNOSIS — IMO0002 Reserved for concepts with insufficient information to code with codable children: Secondary | ICD-10-CM

## 2020-03-09 DIAGNOSIS — G43709 Chronic migraine without aura, not intractable, without status migrainosus: Secondary | ICD-10-CM | POA: Diagnosis present

## 2020-03-09 DIAGNOSIS — Z820 Family history of epilepsy and other diseases of the nervous system: Secondary | ICD-10-CM | POA: Insufficient documentation

## 2020-03-09 MED ORDER — GADOBUTROL 1 MMOL/ML IV SOLN
10.0000 mL | Freq: Once | INTRAVENOUS | Status: AC | PRN
  Administered 2020-03-09: 10 mL via INTRAVENOUS

## 2020-03-20 ENCOUNTER — Ambulatory Visit: Payer: 59 | Attending: Internal Medicine

## 2020-03-20 DIAGNOSIS — Z23 Encounter for immunization: Secondary | ICD-10-CM

## 2020-03-20 NOTE — Progress Notes (Signed)
   Covid-19 Vaccination Clinic  Name:  Emily Yates    MRN: [id number] DOB: 1996-07-03  03/20/2020  Emily Yates was observed post Covid-19 immunization for 15 minutes without incident. She was provided with Vaccine Information Sheet and instruction to access the V-Safe system.   Emily Yates was instructed to call 911 with any severe reactions post vaccine: Marland Kitchen Difficulty breathing  . Swelling of face and throat  . A fast heartbeat  . A bad rash all over body  . Dizziness and weakness   Immunizations Administered    Name Date Dose VIS Date Route   Pfizer COVID-19 Vaccine 03/20/2020 11:04 AM 0.3 mL 11/29/2019 Intramuscular   Manufacturer: [employer], [employer]   Lot: [id number]   NDC: [address]-2

## 2020-03-26 ENCOUNTER — Ambulatory Visit (INDEPENDENT_AMBULATORY_CARE_PROVIDER_SITE_OTHER): Payer: 59 | Admitting: Psychologist

## 2020-03-26 DIAGNOSIS — F320 Major depressive disorder, single episode, mild: Secondary | ICD-10-CM | POA: Diagnosis not present

## 2020-04-02 ENCOUNTER — Ambulatory Visit (INDEPENDENT_AMBULATORY_CARE_PROVIDER_SITE_OTHER): Payer: 59 | Admitting: Psychologist

## 2020-04-02 DIAGNOSIS — F320 Major depressive disorder, single episode, mild: Secondary | ICD-10-CM

## 2020-04-10 ENCOUNTER — Ambulatory Visit: Payer: Self-pay | Admitting: Psychologist

## 2020-04-13 ENCOUNTER — Ambulatory Visit: Payer: 59

## 2020-04-16 ENCOUNTER — Ambulatory Visit: Payer: 59 | Attending: Internal Medicine

## 2020-04-16 DIAGNOSIS — Z23 Encounter for immunization: Secondary | ICD-10-CM

## 2020-04-16 NOTE — Progress Notes (Signed)
   Covid-19 Vaccination Clinic  Name:  Shaquandra Galano    MRN: [id number] DOB: [birth year]  04/16/2020  Ms. Gayheart was observed post Covid-19 immunization for 15 minutes without incident. She was provided with Vaccine Information Sheet and instruction to access the V-Safe system.   Ms. Chalk was instructed to call 911 with any severe reactions post vaccine: Marland Kitchen Difficulty breathing  . Swelling of face and throat  . A fast heartbeat  . A bad rash all over body  . Dizziness and weakness   Immunizations Administered    Name Date Dose VIS Date Route   Pfizer COVID-19 Vaccine 04/16/2020 11:43 AM 0.3 mL 02/12/2019 Intramuscular   Manufacturer: [employer], [employer]   Lot: [id number]   NDC: [address]-6

## 2020-06-03 IMAGING — MR MR HEAD WO/W CM
15 series · 48 of 48 positions shown · IV contrast (10ml Gadavist)
Comparison: None.

CLINICAL DATA: Right lower extremity numbness, headaches, dizziness

EXAM:
MRI HEAD WITHOUT AND WITH CONTRAST
TECHNIQUE: Multiplanar, multiecho pulse sequences of the brain and surrounding
structures were obtained without and with intravenous contrast.
CONTRAST:  10mL GADAVIST GADOBUTROL 1 MMOL/ML IV SOLN

[Series 5: ax dwi_tracew · axial · 3.0mm · 0.60mm/px · z∈[-105,+50]mm · 4 of 48 slices shown]
[im 1/48]
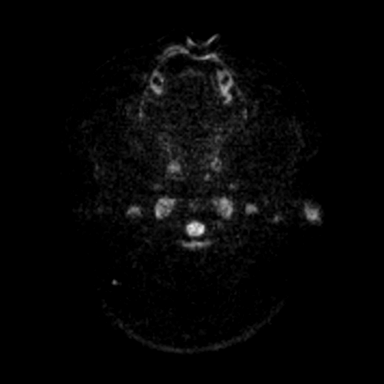
[im 16/48]
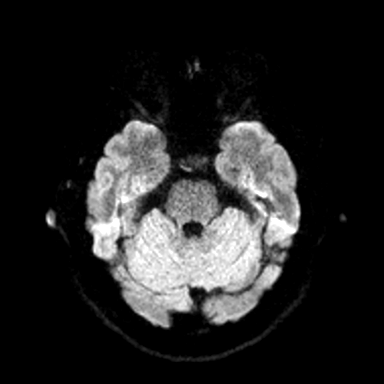
[im 32/48]
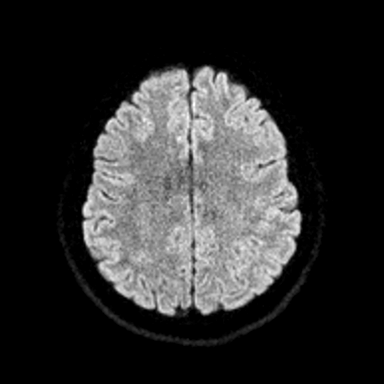
[im 48/48]
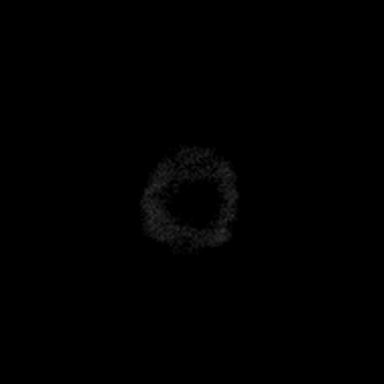

[Series 6: ax dwi_adc · axial · 3.0mm · 0.60mm/px · z∈[-105,+50]mm · 3 of 48 slices shown]
[im 1/48]
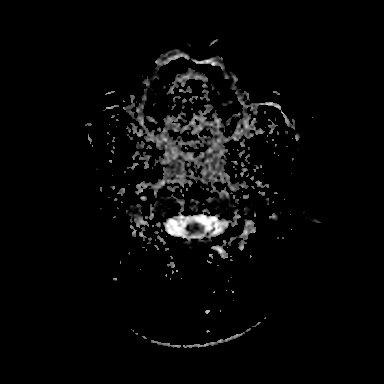
[im 24/48]
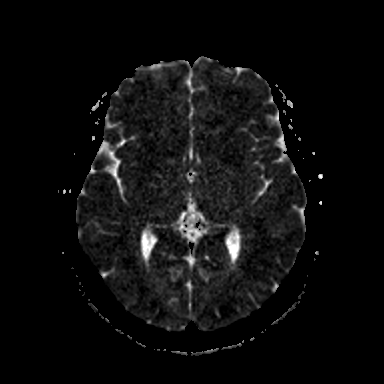
[im 48/48]
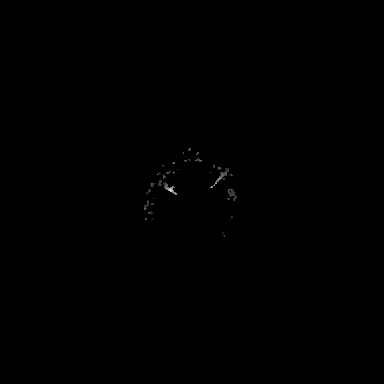

[Series 7: cor dwi_tracew · coronal · 5.0mm · 0.60mm/px · 2 of 38 slices shown]
[im 1/38]
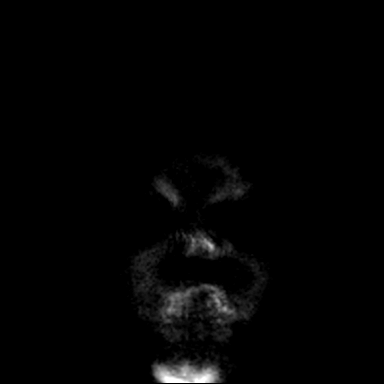
[im 38/38]
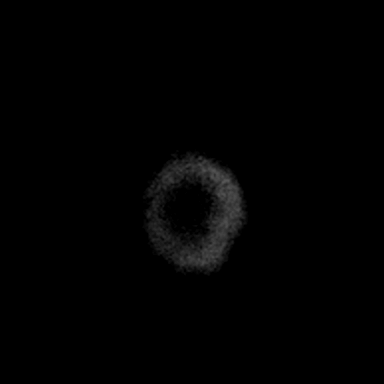

[Series 8: cor dwi_adc · coronal · 5.0mm · 0.60mm/px · 2 of 38 slices shown]
[im 1/38]
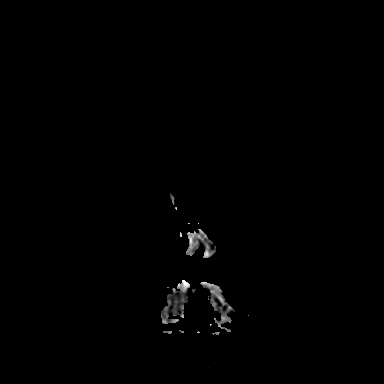
[im 38/38]
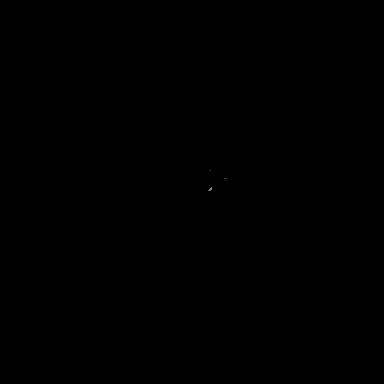

[Series 9: T1 · sagittal · 5.0mm · 0.62mm/px · 1 of 25 slices shown (1 of 2)]
[im 1/25]
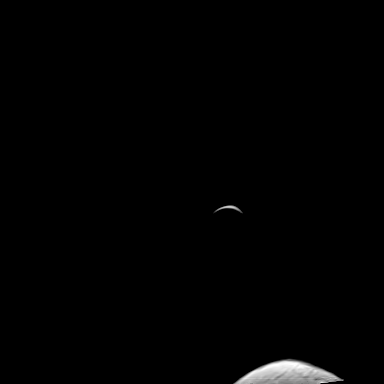

[Series 10: FLAIR · sagittal · 5.0mm · 0.94mm/px · 1 of 25 slices shown (1 of 2)]
[im 1/25]
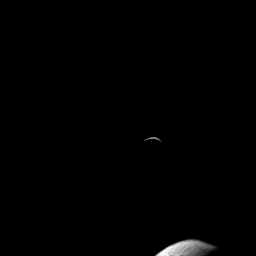

[Series 11: T2 · axial · 5.0mm · 0.53mm/px · 1 of 27 slices shown]
[im 1/27]
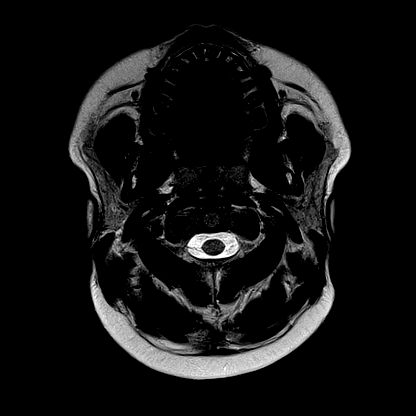

[Series 12: mag_images · axial · 3.0mm · 0.90mm/px · z∈[-119,+57]mm · 3 of 60 slices shown]
[im 1/60]
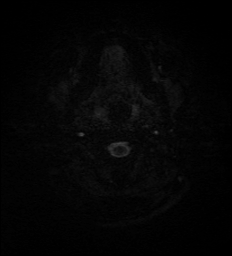
[im 30/60]
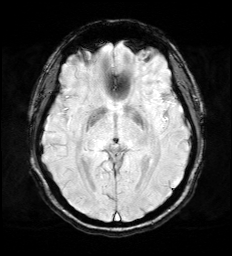
[im 60/60]
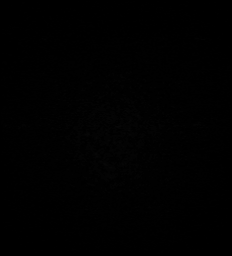

[Series 13: pha_images · axial · 3.0mm · 0.90mm/px · z∈[-119,+54]mm · 3 of 59 slices shown]
[im 1/59]
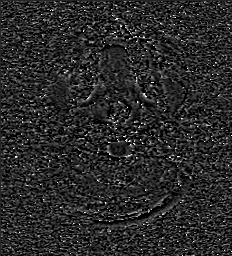
[im 30/59]
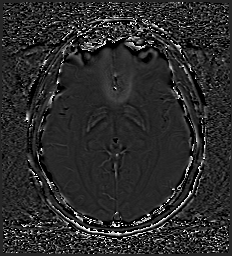
[im 59/59]
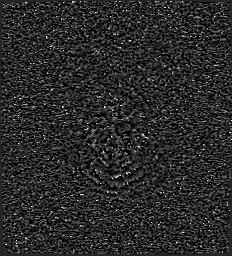

[Series 14: swi_images · axial · 3.0mm · 0.90mm/px · z∈[-119,+57]mm · 3 of 60 slices shown]
[im 1/60]
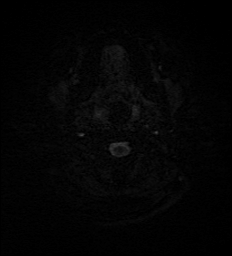
[im 30/60]
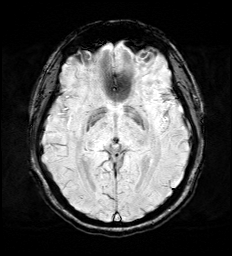
[im 60/60]
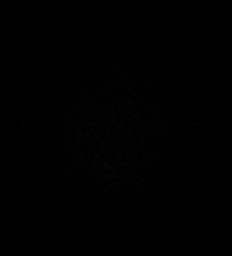

[Series 16: FLAIR · axial · 3.0mm · 0.53mm/px · z∈[-112,+49]mm · 3 of 55 slices shown (2 of 2)]
[im 1/55]
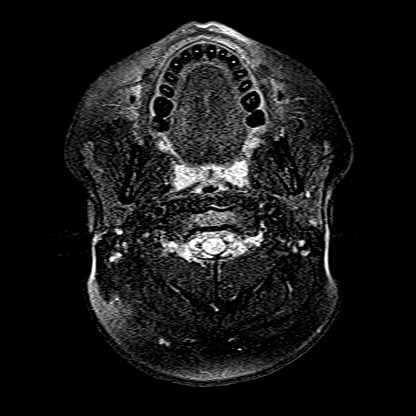
[im 28/55]
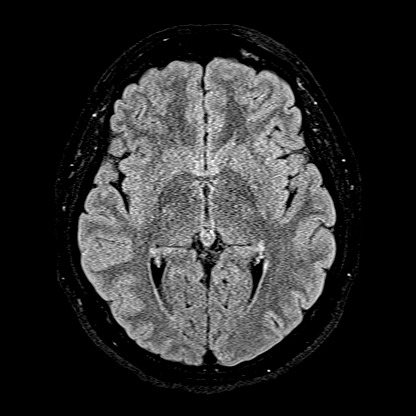
[im 55/55]
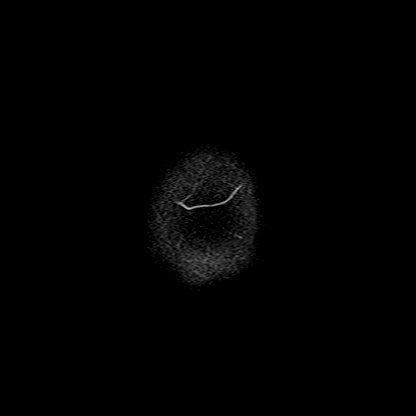

[Series 17: T1 · axial · 1.0mm · 0.98mm/px · z∈[-115,+60]mm · 9 of 176 slices shown (2 of 2)]
[im 1/176]
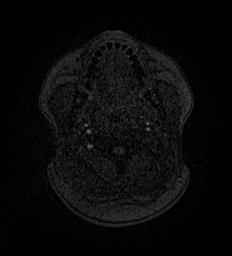
[im 22/176]
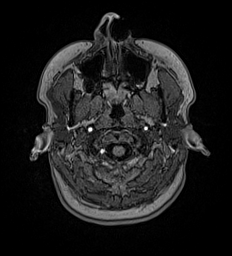
[im 44/176]
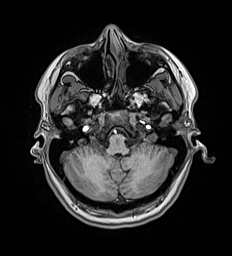
[im 66/176]
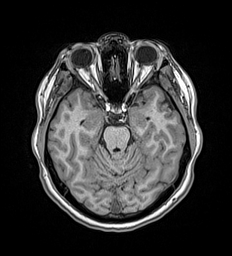
[im 88/176]
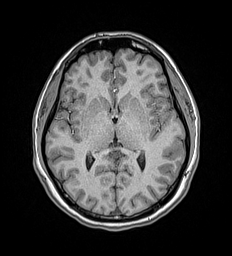
[im 110/176]
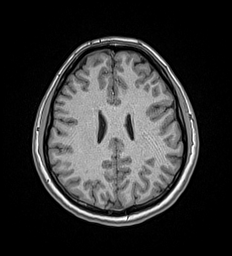
[im 132/176]
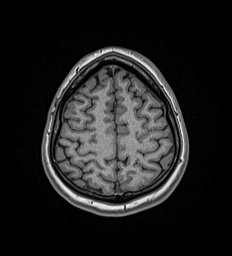
[im 154/176]
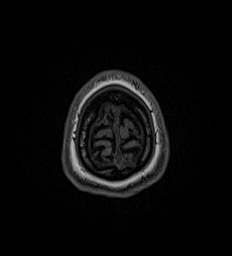
[im 176/176]
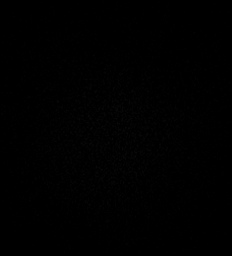

[Series 18: T2 post-contrast · coronal · 5.0mm · 0.57mm/px · 2 of 29 slices shown]
[im 1/29]
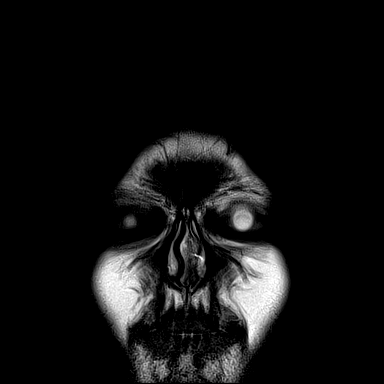
[im 29/29]
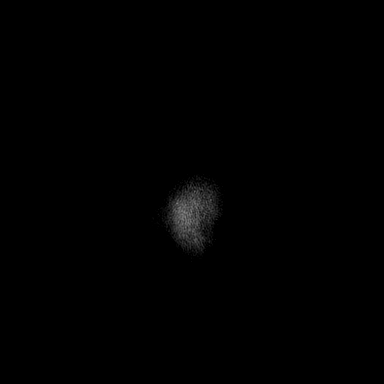

[Series 19: T1 post-contrast · axial · 1.0mm · 0.98mm/px · z∈[-115,+60]mm · 9 of 176 slices shown (1 of 2)]
[im 1/176]
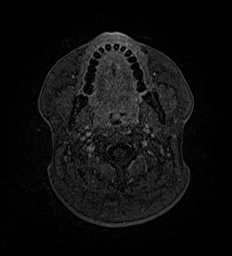
[im 22/176]
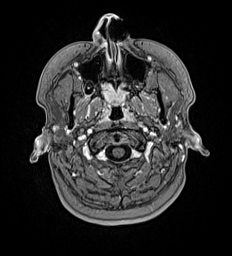
[im 44/176]
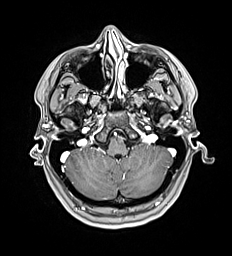
[im 66/176]
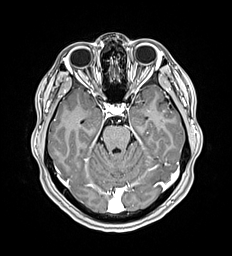
[im 88/176]
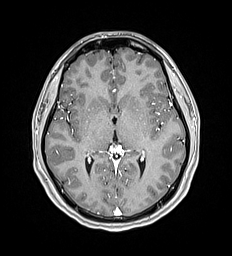
[im 110/176]
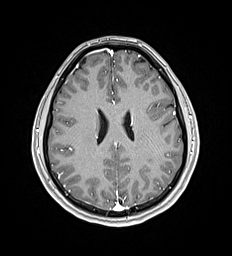
[im 132/176]
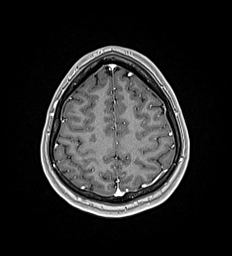
[im 154/176]
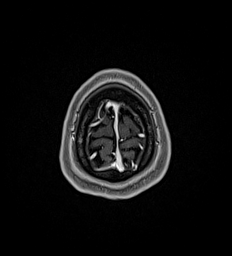
[im 176/176]
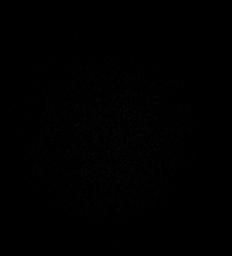

[Series 20: T1 post-contrast · coronal · 5.0mm · 0.57mm/px · 2 of 29 slices shown (2 of 2)]
[im 1/29]
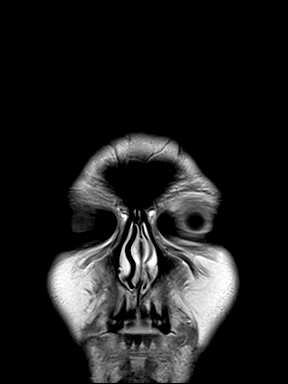
[im 29/29]
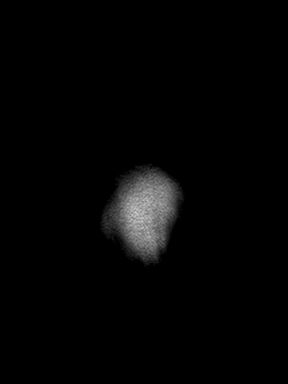

[48 of 48 positions shown; findings below may reference images not displayed]

FINDINGS: Brain: There is no acute infarction or intracranial hemorrhage.
There is no intracranial mass, mass effect, or edema. There is no
hydrocephalus or extra-axial fluid collection. No abnormal
enhancement.

Vascular: Major vessel flow voids at the skull base are preserved.

Skull and upper cervical spine: Normal marrow signal is preserved.

Sinuses/Orbits: Trace mucosal thickening.  Orbits are unremarkable.

Other: Sella is unremarkable.  Mastoid air cells are clear.
IMPRESSION: No evidence of recent infarction, hemorrhage, or mass.

## 2020-07-01 ENCOUNTER — Telehealth (INDEPENDENT_AMBULATORY_CARE_PROVIDER_SITE_OTHER): Payer: 59 | Admitting: Primary Care

## 2020-07-01 ENCOUNTER — Other Ambulatory Visit: Payer: Self-pay

## 2020-07-01 DIAGNOSIS — J302 Other seasonal allergic rhinitis: Secondary | ICD-10-CM | POA: Diagnosis not present

## 2020-07-01 DIAGNOSIS — G43709 Chronic migraine without aura, not intractable, without status migrainosus: Secondary | ICD-10-CM

## 2020-07-01 DIAGNOSIS — IMO0002 Reserved for concepts with insufficient information to code with codable children: Secondary | ICD-10-CM

## 2020-07-01 MED ORDER — PROPRANOLOL HCL ER 80 MG PO CP24: 80.0000 mg | ORAL_CAPSULE | Freq: Every day | ORAL | 3 refills | Status: DC

## 2020-07-01 MED ORDER — BENZONATATE 200 MG PO CAPS: 200.0000 mg | ORAL_CAPSULE | Freq: Three times a day (TID) | ORAL | 0 refills | Status: DC | PRN

## 2020-07-01 MED ORDER — SUMATRIPTAN SUCCINATE 50 MG PO TABS: ORAL_TABLET | ORAL | 0 refills | Status: DC

## 2020-07-01 NOTE — Assessment & Plan Note (Signed)
Improved with daily propranolol, however, she never updated as requested, is now out of medication.   Refills provided for propranolol and sumatriptan, discussed that sumatriptan was PRN.  She will update if anything changes.

## 2020-07-01 NOTE — Progress Notes (Signed)
Subjective:    Patient ID: Emily Yates, female    DOB: 1996-10-03, 24 y.o.   MRN: [id number]  HPI  Virtual Visit via Video Note  I connected with Emily Yates on 07/01/20 at  2:20 PM EDT by a video enabled telemedicine application and verified that I am speaking with the correct person using two identifiers.  Location: Patient: Home Provider: Office Participants: Myself and patient   I discussed the limitations of evaluation and management by telemedicine and the availability of in person appointments. The patient expressed understanding and agreed to proceed.  History of Present Illness:  Emily Yates is a 24 year old female who presents today with a chief complaint of cough.  She also reports post nasal drip, nasal congestion, rhinorrhea, fatigue. Cough is worse in the morning and at night, during the day cough is less. Symptoms began one week ago. She denies diarrhea, loss of taste and smell. She's been taking Flonase and Zyrtec with temporary improvement.   She had her Covid-19 vaccines in April 2021. She had to call out of work today as she didn't sleep last night due to cough. No one else in her home has these symptoms.   She is also needing refills of her propranolol and sumatriptan that were initiated in March 2021 for migraine/headache prevention. She endorses doing very well on daily propranolol, minimal headaches while taking, but ran out in March 2021. She's since used up all of her sumatriptan as headaches and migraines returned.    Observations/Objective:  Alert and oriented. Appears tired. No distress. Speaking in complete sentences.   Assessment and Plan:  Symptoms representative of either viral or allergy etiology. She's had her Covid-19 vaccines, no other Covid-19 symptoms. Continue Flonase and Zyrtec as she's noticing improvement. Rx for [employer] provided. She will update in a few days.  Follow Up Instructions:  Continue taking Flonase and  Zyrtec.  You may take Benzonatate capsules for cough. Take 1 capsule by mouth three times daily as needed for cough.  Please update me in a few days.  It was a pleasure to see you today! Mayra Reel, NP-C    I discussed the assessment and treatment plan with the patient. The patient was provided an opportunity to ask questions and all were answered. The patient agreed with the plan and demonstrated an understanding of the instructions.   The patient was advised to call back or seek an in-person evaluation if the symptoms worsen or if the condition fails to improve as anticipated.    Doreene Nest, NP    Review of Systems  Constitutional: Negative for chills and fever.  HENT: Positive for congestion, postnasal drip and rhinorrhea. Negative for ear pain and sore throat.   Respiratory: Positive for cough. Negative for shortness of breath.   Allergic/Immunologic: Positive for environmental allergies.  Neurological: Positive for headaches.       No past medical history on file.   Social History   Socioeconomic History  . Marital status: Single    Spouse name: Not on file  . Number of children: Not on file  . Years of education: Not on file  . Highest education level: Not on file  Occupational History  . Not on file  Tobacco Use  . Smoking status: Smoker, Current Status Unknown  . Smokeless tobacco: Never Used  . Tobacco comment: Vaping  Vaping Use  . Vaping Use: Every day  Substance and Sexual Activity  . Alcohol use: Yes  .  Drug use: Not on file  . Sexual activity: Not on file  Other Topics Concern  . 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   Aspires to pursue MBA.   Social Determinants of Health   Financial Resource Strain:   . Difficulty of Paying Living Expenses:   Food Insecurity:   . Worried About Programme researcher, broadcasting/film/video in the Last Year:   . Barista in the Last Year:   Transportation Needs:   .  Freight forwarder (Medical):   Marland Kitchen Lack of Transportation (Non-Medical):   Physical Activity:   . Days of Exercise per Week:   . Minutes of Exercise per Session:   Stress:   . Feeling of Stress :   Social Connections:   . Frequency of Communication with Friends and Family:   . Frequency of Social Gatherings with Friends and Family:   . Attends Religious Services:   . Active Member of Clubs or Organizations:   . Attends Banker Meetings:   Marland Kitchen Marital Status:   Intimate Partner Violence:   . Fear of Current or Ex-Partner:   . Emotionally Abused:   Marland Kitchen Physically Abused:   . Sexually Abused:     No past surgical history on file.  Family History  Problem Relation Age of Onset  . Arthritis Mother   . Depression Mother   . Miscarriages / [place] Mother   . Depression Father   . Hypertension Father     No Known Allergies  Current Outpatient Medications on File Prior to Visit  Medication Sig Dispense Refill  . predniSONE (DELTASONE) 20 MG tablet Take 2 tablets by mouth daily for three days, then 1 tablet daily for three days. 9 tablet 0  . sertraline (ZOLOFT) 25 MG tablet Take 1 tablet (25 mg total) by mouth daily. For anxiety. 90 tablet 3  . Vitamin D, Ergocalciferol, (DRISDOL) 1.25 MG (50000 UNIT) CAPS capsule Take 1 capsule by mouth once weekly for 12 weeks. 12 capsule 0   Current Facility-Administered Medications on File Prior to Visit  Medication Dose Route Frequency Provider Last Rate Last Admin  . cyanocobalamin ((VITAMIN B-12)) injection 1,000 mcg  1,000 mcg Intramuscular Q30 days Doreene Nest, NP   1,000 mcg at 02/20/19 5102    There were no vitals taken for this visit.   Objective:   Physical Exam Constitutional:      Appearance: She is not ill-appearing.     Comments: Appears tired  Pulmonary:     Effort: Pulmonary effort is normal.     Comments: No cough during visit Neurological:     Mental Status: She is alert.              Assessment & Plan:

## 2020-07-01 NOTE — Patient Instructions (Signed)
Continue taking Flonase and Zyrtec.  You may take Benzonatate capsules for cough. Take 1 capsule by mouth three times daily as needed for cough.  Please update me in a few days.  It was a pleasure to see you today! Mayra Reel, NP-C

## 2020-07-01 NOTE — Assessment & Plan Note (Signed)
Symptoms representative of either viral or allergy etiology. She's had her Covid-19 vaccines, no other Covid-19 symptoms. Continue Flonase and Zyrtec as she's noticing improvement. Rx for [employer] provided. She will update in a few days.

## 2020-07-02 ENCOUNTER — Encounter: Payer: Self-pay | Admitting: Primary Care

## 2020-12-29 ENCOUNTER — Other Ambulatory Visit: Payer: Self-pay

## 2020-12-29 DIAGNOSIS — E559 Vitamin D deficiency, unspecified: Secondary | ICD-10-CM

## 2020-12-29 MED ORDER — VITAMIN D (ERGOCALCIFEROL) 1.25 MG (50000 UNIT) PO CAPS: ORAL_CAPSULE | ORAL | 0 refills | Status: DC

## 2021-07-14 ENCOUNTER — Encounter: Payer: Self-pay | Admitting: Primary Care

## 2021-07-14 ENCOUNTER — Other Ambulatory Visit: Payer: Self-pay

## 2021-07-14 ENCOUNTER — Ambulatory Visit (INDEPENDENT_AMBULATORY_CARE_PROVIDER_SITE_OTHER): Payer: 59 | Admitting: Primary Care

## 2021-07-14 VITALS — BP 110/64 | HR 82 | Temp 98.4°F | Ht 64.25 in | Wt 257.0 lb

## 2021-07-14 DIAGNOSIS — E538 Deficiency of other specified B group vitamins: Secondary | ICD-10-CM

## 2021-07-14 DIAGNOSIS — G43709 Chronic migraine without aura, not intractable, without status migrainosus: Secondary | ICD-10-CM

## 2021-07-14 DIAGNOSIS — F4323 Adjustment disorder with mixed anxiety and depressed mood: Secondary | ICD-10-CM

## 2021-07-14 DIAGNOSIS — M25579 Pain in unspecified ankle and joints of unspecified foot: Secondary | ICD-10-CM | POA: Insufficient documentation

## 2021-07-14 DIAGNOSIS — E559 Vitamin D deficiency, unspecified: Secondary | ICD-10-CM

## 2021-07-14 DIAGNOSIS — R5383 Other fatigue: Secondary | ICD-10-CM

## 2021-07-14 DIAGNOSIS — R300 Dysuria: Secondary | ICD-10-CM | POA: Diagnosis not present

## 2021-07-14 DIAGNOSIS — R519 Headache, unspecified: Secondary | ICD-10-CM

## 2021-07-14 DIAGNOSIS — M25571 Pain in right ankle and joints of right foot: Secondary | ICD-10-CM

## 2021-07-14 LAB — POCT URINALYSIS DIP (CLINITEK)
Bilirubin, UA: NEGATIVE
Glucose, UA: NEGATIVE mg/dL
Nitrite, UA: NEGATIVE
Spec Grav, UA: 1.02 (ref 1.010–1.025)
Urobilinogen, UA: 0.2 E.U./dL
pH, UA: 6 (ref 5.0–8.0)

## 2021-07-14 MED ORDER — SULFAMETHOXAZOLE-TRIMETHOPRIM 800-160 MG PO TABS: 1.0000 | ORAL_TABLET | Freq: Two times a day (BID) | ORAL | 0 refills | Status: AC

## 2021-07-14 MED ORDER — SERTRALINE HCL 50 MG PO TABS
50.0000 mg | ORAL_TABLET | Freq: Every day | ORAL | 3 refills | Status: AC
Start: 2021-07-14 — End: ?

## 2021-07-14 MED ORDER — SUMATRIPTAN SUCCINATE 50 MG PO TABS: ORAL_TABLET | ORAL | 0 refills | Status: AC

## 2021-07-14 MED ORDER — PROPRANOLOL HCL ER 80 MG PO CP24: 80.0000 mg | ORAL_CAPSULE | Freq: Every day | ORAL | 3 refills | Status: AC

## 2021-07-14 NOTE — Assessment & Plan Note (Signed)
Deteriorated since off of Zoloft 25 mg. She did notice some improvement, suspect she was not at a therapeutic dose at 25 mg.  Will resume Zoloft but with 50 mg tablet. She will take 1/2 tablet daily for one week then increase to 50 mg thereafter.   She will update if no improvement.

## 2021-07-14 NOTE — Progress Notes (Signed)
Subjective:    Patient ID: Emily Yates, female    DOB: [birth year], 25 y.o.   MRN: [id number]  HPI  Emily Yates is a very pleasant 25 y.o. female with a history of Vitamin B12 deficiency, anxiety and depression, chronic migraines who presents today for follow up of chronic conditions and for symptoms of dysuria.   She is currently prescribed sertraline 25 mg once daily for anxiety and depression. She stopped taking this around October 2021, she's not sure why but did find it was somewhat effective. Now she struggles with symptoms of little motivation, irritability, feeling anxious. She would like to resume.   She is currently prescribed propranolol ER 80 mg for chronic migraine prevention and sumatriptan for migraines. She stopped her propranolol in October 2021. She is experiencing bilateral frontal headaches every other days, mostly without radiation. Migraines occur once weekly, located to frontal lobes with nausea, sensitivity to light. She is out of her sumatriptan. Both propranolol and sumatriptan were effective. She would like refills.   She is not taking vitamin D or B12 supplements, stopped taking over one year ago. History of low vitamin D and b12 levels.   Symptoms of esophageal burning, nausea for which she notices late at night. She will eat later and then go to bed within an hour of eating. She's taking omeprazole as needed during symptoms with improvement. She has no symptoms during the day. She is aware of a few trigger foods.   She mentions urinary frequency and dysuria that began 4-5 days ago. She has noticed cloudy urine. She denies hematuria, vaginal itching and discharge. No recent UTI.   Acute right ankle pain that began about one week ago after twisting her ankle. History of twisting the same ankle in February 2022. She's questioning whether there may be some additional injury. She is active at work, is ambulating without support. Does not wear a brace or wrap the  ankle. Denies increased swelling. Her pain is intermittent with certain movements of her ankle.   BP Readings from Last 3 Encounters:  07/14/21 110/64  02/21/20 126/76  01/28/19 120/76      Review of Systems  Constitutional:  Positive for fatigue.  Genitourinary:  Positive for dysuria and frequency. Negative for hematuria and vaginal discharge.  Musculoskeletal:  Positive for arthralgias. Negative for joint swelling.  Neurological:  Positive for headaches.  Psychiatric/Behavioral:  The patient is nervous/anxious.         History reviewed. No pertinent past medical history.  Social History   Socioeconomic History   Marital status: Single    Spouse name: Not on file   Number of children: Not on file   Years of education: Not on file   Highest education level: Not on file  Occupational History   Not on file  Tobacco Use   Smoking status: Smoker, Current Status Unknown   Smokeless tobacco: Never   Tobacco comments:    Vaping  Vaping Use   Vaping Use: Every day  Substance and Sexual Activity   Alcohol use: Yes   Drug use: Not on file   Sexual activity: Not on file  Other Topics Concern   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   Aspires to pursue MBA.   Social Determinants of Health   Financial Resource Strain: Not on file  Food Insecurity: Not on file  Transportation Needs: Not on file  Physical Activity: Not on file  Stress:  Not on file  Social Connections: Not on file  Intimate Partner Violence: Not on file    History reviewed. No pertinent surgical history.  Family History  Problem Relation Age of Onset   Arthritis Mother    Depression Mother    Miscarriages / [place] Mother    Depression Father    Hypertension Father     No Known Allergies  No current outpatient medications on file prior to visit.   Current Facility-Administered Medications on File Prior to Visit  Medication Dose Route  Frequency Provider Last Rate Last Admin   cyanocobalamin ((VITAMIN B-12)) injection 1,000 mcg  1,000 mcg Intramuscular Q30 days Doreene Nest, NP   1,000 mcg at 02/20/19 0936    BP 110/64   Pulse 82   Temp 98.4 F (36.9 C) (Temporal)   Ht 5' 4.25" (1.632 m)   Wt 257 lb (116.6 kg)   SpO2 98%   BMI 43.77 kg/m  Objective:   Physical Exam Cardiovascular:     Rate and Rhythm: Normal rate and regular rhythm.  Pulmonary:     Effort: Pulmonary effort is normal.     Breath sounds: Normal breath sounds.  Musculoskeletal:     Cervical back: Neck supple.     Right ankle: No swelling. Normal range of motion.  Skin:    General: Skin is warm and dry.          Assessment & Plan:      This visit occurred during the SARS-CoV-2 public health emergency.  Safety protocols were in place, including screening questions prior to the visit, additional usage of staff PPE, and extensive cleaning of exam room while observing appropriate contact time as indicated for disinfecting solutions.

## 2021-07-14 NOTE — Assessment & Plan Note (Signed)
Occurring every other day on average, also with frequent migraines. Will resume treatment.   Refills provided for propranolol ER 80 mg to take daily and sumatriptan 50 mg PRN.

## 2021-07-14 NOTE — Assessment & Plan Note (Signed)
Since re-injury one week ago. Discussed options including PT, imaging, orthopedic evaluation. Also discussed conservative measures with bracing, elevating, supportive shoes.  Exam today overall unremarkable and her pain is intermittent when moving in a certain direction.   She declines treatment today but will notify if symptoms become bothersome.

## 2021-07-14 NOTE — Assessment & Plan Note (Signed)
No use of B12 supplements or injections. Repeat B12 level pending today.  If we decide to treat, she prefers injections and can do them at home. She's done these previously. Her sister is a Publishing rights manager.

## 2021-07-14 NOTE — Assessment & Plan Note (Signed)
No use of propranolol ER 80 mg or sumatriptan 50 mg PRN. Frequent headaches and migraines.  Refills provided for both propranolol ER 80 mg daily and sumatriptan 50 mg PRN.

## 2021-07-14 NOTE — Assessment & Plan Note (Signed)
Not currently on vitamin D treatment. Repeat Vitamin D level pending.

## 2021-07-14 NOTE — Assessment & Plan Note (Signed)
Acute for the last 4-5 days.  UA today with 1+ leuks, 2+ blood, no nitrites. Culture sent.  Given symptoms coupled with UA results (she is not menstruating) will treat with Bactrim DS tablets BID x 3 days.

## 2021-07-14 NOTE — Patient Instructions (Addendum)
Stop by the lab prior to leaving today. I will notify you of your results once received.   Start Bactrim DS (sulfamethoxazole/trimethoprim) tablets for urinary tract infection. Take 1 tablet by mouth twice daily for 3 days.  Star propranolol ER 80 mg once daily for headache prevention.   You can use the sumatriptan (Imitrex) medication as needed for migraines. Take 1 tablet at migraine onset, may take another tablet 2 hours later if migraine persists.   Start the omeprazole 20 mg nightly for heartburn. Take this daily for a few weeks then stop. Avoid laying flat within 2 hours after eating. Take a look at the information below.  Start sertraline (Zoloft) 50 mg for anxiety and depression. Take 1/2 tablet by mouth once daily for about one week, then increase to 1 full tablet thereafter.   It was a pleasure to see you today!  Food Choices for Gastroesophageal Reflux Disease, Adult When you have gastroesophageal reflux disease (GERD), the foods you eat and your eating habits are very important. Choosing the right foods can help ease your discomfort. Think about working with a food expert (dietitian) to help you make good choices. What are tips for following this plan? Reading food labels Look for foods that are low in saturated fat. Foods that may help with your symptoms include: Foods that have less than 5% of daily value (DV) of fat. Foods that have 0 grams of trans fat. Cooking Do not fry your food. Cook your food by baking, steaming, grilling, or broiling. These are all methods that do not need a lot of fat for cooking. To add flavor, try to use herbs that are low in spice and acidity. Meal planning  Choose healthy foods that are low in fat, such as: Fruits and vegetables. Whole grains. Low-fat dairy products. Lean meats, fish, and poultry. Eat small meals often instead of eating 3 large meals each day. Eat your meals slowly in a place where you are relaxed. Avoid bending over or  lying down until 2-3 hours after eating. Limit high-fat foods such as fatty meats or fried foods. Limit your intake of fatty foods, such as oils, butter, and shortening. Avoid the following as told by your doctor: Foods that cause symptoms. These may be different for different people. Keep a food diary to keep track of foods that cause symptoms. Alcohol. Drinking a lot of liquid with meals. Eating meals during the 2-3 hours before bed.  Lifestyle Stay at a healthy weight. Ask your doctor what weight is healthy for you. If you need to lose weight, work with your doctor to do so safely. Exercise for at least 30 minutes on 5 or more days each week, or as told by your doctor. Wear loose-fitting clothes. Do not smoke or use any products that contain nicotine or tobacco. If you need help quitting, ask your doctor. Sleep with the head of your bed higher than your feet. Use a wedge under the mattress or blocks under the bed frame to raise the head of the bed. Chew sugar-free gum after meals. What foods should eat?  Eat a healthy, well-balanced diet of fruits, vegetables, whole grains, low-fatdairy products, lean meats, fish, and poultry. Each person is different. Foods that may cause symptoms in one person may not cause any symptoms inanother person. Work with your doctor to find foods that are safe for you. The items listed above may not be a complete list of what you can eat and drink. Contact a food expert  for more options. What foods should I avoid? Limiting some of these foods may help in managing the symptoms of GERD. Everyone is different. Talk with a food expert or your doctor to help you findthe exact foods to avoid, if any. Fruits Any fruits prepared with added fat. Any fruits that cause symptoms. For some people, this may include citrus fruits, such as oranges, grapefruit, pineapple,and lemons. Vegetables Deep-fried vegetables. [place] fries. Any vegetables prepared with added fat. Any  vegetables that cause symptoms. For some people, this may include tomatoesand tomato products, chili peppers, onions and garlic, and horseradish. Grains Pastries or quick breads with added fat. Meats and other proteins High-fat meats, such as fatty beef or pork, hot dogs, ribs, ham, sausage, salami, and bacon. Fried meat or protein, including fried fish and friedchicken. Nuts and nut butters, in large amounts. Dairy Whole milk and chocolate milk. Sour cream. Cream. Ice cream. Cream cheese.Milkshakes. Fats and oils Butter. Margarine. Shortening. Ghee. Beverages Coffee and tea, with or without caffeine. Carbonated beverages. Sodas. Energy drinks. Fruit juice made with acidic fruits, such as orange or grapefruit.Tomato juice. Alcoholic drinks. Sweets and desserts Chocolate and cocoa. Donuts. Seasonings and condiments Pepper. Peppermint and spearmint. Added salt. Any condiments, herbs, or seasonings that cause symptoms. For some people, this may include curry, hotsauce, or vinegar-based salad dressings. The items listed above may not be a complete list of what you should not eat and drink. Contact a food expert for more options. Questions to ask your doctor Diet and lifestyle changes are often the first steps that are taken to manage symptoms of GERD. If diet and lifestyle changes do not help, talk with yourdoctor about taking medicines. Where to find more information [employer] for Gastrointestinal Disorders: aboutgerd.org Summary When you have GERD, food and lifestyle choices are very important in easing your symptoms. Eat small meals often instead of 3 large meals a day. Eat your meals slowly and in a place where you are relaxed. Avoid bending over or lying down until 2-3 hours after eating. Limit high-fat foods such as fatty meats or fried foods. This information is not intended to replace advice given to you by your health care provider. Make sure you discuss any questions you  have with your healthcare provider. Document Revised: 06/15/2020 Document Reviewed: 06/15/2020 Elsevier Patient Education  2022 [employer].

## 2021-07-15 ENCOUNTER — Other Ambulatory Visit: Payer: Self-pay | Admitting: Primary Care

## 2021-07-15 DIAGNOSIS — E538 Deficiency of other specified B group vitamins: Secondary | ICD-10-CM

## 2021-07-15 LAB — COMPREHENSIVE METABOLIC PANEL
ALT: 14 U/L (ref 0–35)
AST: 14 U/L (ref 0–37)
Albumin: 4.2 g/dL (ref 3.5–5.2)
Alkaline Phosphatase: 58 U/L (ref 39–117)
BUN: 10 mg/dL (ref 6–23)
CO2: 26 mEq/L (ref 19–32)
Calcium: 9.4 mg/dL (ref 8.4–10.5)
Chloride: 103 mEq/L (ref 96–112)
Creatinine, Ser: 0.91 mg/dL (ref 0.40–1.20)
GFR: 88.18 mL/min (ref >60.00)
Glucose, Bld: 95 mg/dL (ref 70–99)
Potassium: 4.5 mEq/L (ref 3.5–5.1)
Sodium: 137 mEq/L (ref 135–145)
Total Bilirubin: 0.3 mg/dL (ref 0.2–1.2)
Total Protein: 7 g/dL (ref 6.0–8.3)

## 2021-07-15 LAB — CBC
HCT: 33.8 % — ABNORMAL LOW (ref 36.0–46.0)
Hemoglobin: 11.2 g/dL — ABNORMAL LOW (ref 12.0–15.0)
MCHC: 33.1 g/dL (ref 30.0–36.0)
MCV: 80.9 fl (ref 78.0–100.0)
Platelets: 321 10*3/uL (ref 150.0–400.0)
RBC: 4.18 Mil/uL (ref 3.87–5.11)
RDW: 14.6 % (ref 11.5–15.5)
WBC: 11.5 10*3/uL — ABNORMAL HIGH (ref 4.0–10.5)

## 2021-07-15 LAB — VITAMIN D 25 HYDROXY (VIT D DEFICIENCY, FRACTURES): VITD: 24.34 ng/mL — ABNORMAL LOW (ref 30.00–100.00)

## 2021-07-15 LAB — VITAMIN B12: Vitamin B-12: 169 pg/mL — ABNORMAL LOW (ref 211–911)

## 2021-07-15 MED ORDER — CYANOCOBALAMIN 1000 MCG/ML IJ SOLN: INTRAMUSCULAR | 0 refills | Status: AC

## 2021-07-15 MED ORDER — "SYRINGE/NEEDLE (DISP) 23G X 1"" 1 ML MISC": 0 refills | Status: AC

## 2021-07-16 LAB — URINE CULTURE
MICRO NUMBER:: 12169599
SPECIMEN QUALITY:: ADEQUATE
